# Patient Record
Sex: Female | Born: 1985 | Race: Asian | Hispanic: No | Marital: Married | Smoking: Never smoker
Health system: Southern US, Community
[De-identification: ages and names within clinical notes are randomized; demographics above are authoritative.]

---

## 2020-09-06 ENCOUNTER — Other Ambulatory Visit: Payer: Self-pay

## 2021-12-16 ENCOUNTER — Encounter: Payer: Self-pay | Admitting: Gastroenterology

## 2022-01-21 ENCOUNTER — Ambulatory Visit (INDEPENDENT_AMBULATORY_CARE_PROVIDER_SITE_OTHER): Payer: Commercial Managed Care - HMO | Admitting: Gastroenterology

## 2022-01-21 ENCOUNTER — Encounter: Payer: Self-pay | Admitting: Gastroenterology

## 2022-01-21 VITALS — BP 118/76 | HR 99 | Ht 65.0 in | Wt 158.1 lb

## 2022-01-21 DIAGNOSIS — K220 Achalasia of cardia: Secondary | ICD-10-CM

## 2022-01-21 DIAGNOSIS — R1319 Other dysphagia: Secondary | ICD-10-CM | POA: Diagnosis not present

## 2022-01-21 DIAGNOSIS — K219 Gastro-esophageal reflux disease without esophagitis: Secondary | ICD-10-CM

## 2022-01-21 MED ORDER — PANTOPRAZOLE SODIUM 40 MG PO TBEC
40.0000 mg | DELAYED_RELEASE_TABLET | Freq: Two times a day (BID) | ORAL | 4 refills | Status: AC
Start: 2022-01-21 — End: ?

## 2022-01-21 NOTE — Patient Instructions (Addendum)
If you are age 36 or older, your body mass index should be between 23-30. Your Body mass index is 26.31 kg/m. If this is out of the aforementioned range listed, please consider follow up with your Primary Care Provider.  If you are age 36 or younger, your body mass index should be between 19-25. Your Body mass index is 26.31 kg/m. If this is out of the aformentioned range listed, please consider follow up with your Primary Care Provider.   ________________________________________________________  The [facility] providers would like to encourage you to use [facility] to communicate with providers for non-urgent requests or questions.  Due to long hold times on the telephone, sending your provider a message by [facility] may be a faster and more efficient way to get a response.  Please allow 48 business hours for a response.  Please remember that this is for non-urgent requests.  _______________________________________________________  We have sent the following medications to your pharmacy for you to pick up at your convenience: Protonix 40mg  1 tablet 2 times a day  You have been scheduled for an endoscopy. Please follow written instructions given to you at your visit today. If you use inhalers (even only as needed), please bring them with you on the day of your procedure.  Chew feels well and slowly   You have been scheduled for a Barium Esophogram at [facility] (1st floor of the hospital) on 01-29-2022 at 11am. Please arrive 15 minutes prior to your appointment for registration. Make certain not to have anything to eat or drink 3 hours prior to your test. If you need to reschedule for any reason, please contact radiology at [phone] to do so. __________________________________________________________________ A barium swallow is an examination that concentrates on views of the esophagus. This tends to be a double contrast exam (barium and two liquids which, when combined, create a gas  to distend the wall of the oesophagus) or single contrast (non-ionic iodine based). The study is usually tailored to your symptoms so a good history is essential. Attention is paid during the study to the form, structure and configuration of the esophagus, looking for functional disorders (such as aspiration, dysphagia, achalasia, motility and reflux) EXAMINATION You may be asked to change into a gown, depending on the type of swallow being performed. A radiologist and radiographer will perform the procedure. The radiologist will advise you of the type of contrast selected for your procedure and direct you during the exam. You will be asked to stand, sit or lie in several different positions and to hold a small amount of fluid in your mouth before being asked to swallow while the imaging is performed .In some instances you may be asked to swallow barium coated marshmallows to assess the motility of a solid food bolus. The exam can be recorded as a digital or video fluoroscopy procedure. POST PROCEDURE It will take 1-2 days for the barium to pass through your system. To facilitate this, it is important, unless otherwise directed, to increase your fluids for the next 24-48hrs and to resume your normal diet.  This test typically takes about 30 minutes to perform. __________________________________________________________________________________

## 2022-01-21 NOTE — Progress Notes (Signed)
Chief Complaint: Dysphagia  Referring Provider:  Lorenda Ishihara, MD      ASSESSMENT AND PLAN;   #1. GERD with eso dysphagia. D/d includes eso stricture, Schatzki's ring, motility disorder, EoE, pill induced esophagitis, r/o eso carcinoma/extrinsic lesions or Achalasia.  Plan: -Continue Protonix 40 mg p.o. BID -Ba swallow with Ba tablet as well ASAP -EGD with eso bx and possibly dil.  Discussed risks and benefits including small but definite risks of eso perforation, bleeding, risks of anesthesia.  The benefits were also discussed. -I have instructed patient to chew foods and eat slowly.   HPI:    Catherine Zavala is a 36 y.o. female  Very pleasant Accompanied by her husband  C/O dysphagia to both solids/liquids, mid chest x 3 to 4 months.  Getting worse.  Associated regurgitation.  No odynophagia.  Did have heartburn previously.  Seen by Dr. Chales Salmon, started on Protonix 40 once a day which has been increased to twice daily yesterday.  Not much relief with Protonix.  No significant N/V.  No weight loss.  She denies having any melena or hematochezia.  No family history of esophageal problems  No sodas, chocolates, chewing gums, artificial sweeteners and candy. No NSAIDs  SH-married, 2 children aged 11/7.  No problems during pregnancy.  Family History  Problem Relation Age of Onset   Breast cancer Maternal Grandmother    Colon cancer Neg Hx    Rectal cancer Neg Hx    Stomach cancer Neg Hx    Esophageal cancer Neg Hx     Social History   Tobacco Use   Smoking status: Never   Smokeless tobacco: Never  Vaping Use   Vaping Use: Never used  Substance Use Topics   Alcohol use: Never    Current Outpatient Medications  Medication Sig Dispense Refill   pantoprazole (PROTONIX) 40 MG tablet Take by mouth.     No current facility-administered medications for this visit.    No Known Allergies  Review of Systems:  Constitutional: Denies fever,  chills, diaphoresis, appetite change and fatigue.  HEENT: Denies photophobia, eye pain, redness, hearing loss, ear pain, congestion, sore throat, rhinorrhea, sneezing, mouth sores, neck pain, neck stiffness and tinnitus.   Respiratory: Denies SOB, DOE, cough, chest tightness,  and wheezing.   Cardiovascular: Denies chest pain, palpitations and leg swelling.  Genitourinary: Denies dysuria, urgency, frequency, hematuria, flank pain and difficulty urinating.  Musculoskeletal: Denies myalgias, back pain, joint swelling, arthralgias and gait problem.  Skin: No rash.  Neurological: Denies dizziness, seizures, syncope, weakness, light-headedness, numbness and headaches.  Hematological: Denies adenopathy. Easy bruising, personal or family bleeding history  Psychiatric/Behavioral: No anxiety or depression     Physical Exam:    BP 118/76   Pulse 99   Ht 5\' 5"  (1.651 m)   Wt 158 lb 2 oz (71.7 kg)   SpO2 99%   BMI 26.31 kg/m  Wt Readings from Last 3 Encounters:  01/21/22 158 lb 2 oz (71.7 kg)   Constitutional:  Well-developed, in no acute distress. Psychiatric: Normal mood and affect. Behavior is normal. HEENT: Pupils normal.  Conjunctivae are normal. No scleral icterus. Cardiovascular: Normal rate, regular rhythm. No edema Pulmonary/chest: Effort normal and breath sounds normal. No wheezing, rales or rhonchi. Abdominal: Soft, nondistended. Nontender. Bowel sounds active throughout. There are no masses palpable. No hepatomegaly. Rectal: Deferred Neurological: Alert and oriented to person place and time. Skin: Skin is warm and dry. No rashes noted    01/23/22, MD 01/21/2022, 3:33  PM  Cc: Lorenda Ishihara, MD

## 2022-01-28 ENCOUNTER — Telehealth: Payer: Self-pay | Admitting: Gastroenterology

## 2022-01-28 ENCOUNTER — Other Ambulatory Visit: Payer: Self-pay | Admitting: Gastroenterology

## 2022-01-28 DIAGNOSIS — R1319 Other dysphagia: Secondary | ICD-10-CM

## 2022-01-28 DIAGNOSIS — K219 Gastro-esophageal reflux disease without esophagitis: Secondary | ICD-10-CM

## 2022-01-28 NOTE — Telephone Encounter (Signed)
Spoke to pt. She was told to call [facility] imaging to schedule her appt

## 2022-01-28 NOTE — Telephone Encounter (Signed)
Patient called regarding the imaging she has scheduled for tomorrow.  Her insurance will not cover [facility] and told her she needed to be referred to [facility], fax number [phone].  They have an appointment available for her tomorrow, but the referral will need to be sent to them asap so she can secure that appointment.  Please call patient and advise.  Thank you.

## 2022-01-29 ENCOUNTER — Ambulatory Visit
Admission: RE | Admit: 2022-01-29 | Discharge: 2022-01-29 | Disposition: A | Discharge status: Home / Self Care | Payer: Commercial Managed Care - HMO | Source: Ambulatory Visit | Attending: Gastroenterology | Admitting: Gastroenterology

## 2022-01-29 ENCOUNTER — Other Ambulatory Visit (HOSPITAL_COMMUNITY): Payer: Commercial Managed Care - HMO

## 2022-01-29 DIAGNOSIS — R1319 Other dysphagia: Secondary | ICD-10-CM

## 2022-01-29 DIAGNOSIS — K219 Gastro-esophageal reflux disease without esophagitis: Secondary | ICD-10-CM

## 2022-02-04 ENCOUNTER — Encounter: Payer: Self-pay | Admitting: Gastroenterology

## 2022-02-04 ENCOUNTER — Ambulatory Visit (AMBULATORY_SURGERY_CENTER): Payer: Commercial Managed Care - HMO | Admitting: Gastroenterology

## 2022-02-04 VITALS — BP 108/70 | HR 89 | Temp 98.4°F | Resp 14 | Ht 65.0 in | Wt 158.0 lb

## 2022-02-04 DIAGNOSIS — K219 Gastro-esophageal reflux disease without esophagitis: Secondary | ICD-10-CM | POA: Diagnosis not present

## 2022-02-04 DIAGNOSIS — K2970 Gastritis, unspecified, without bleeding: Secondary | ICD-10-CM | POA: Diagnosis not present

## 2022-02-04 DIAGNOSIS — K220 Achalasia of cardia: Secondary | ICD-10-CM

## 2022-02-04 MED ORDER — SODIUM CHLORIDE 0.9 % IV SOLN: 500.0000 mL | Freq: Once | INTRAVENOUS | Status: DC

## 2022-02-04 NOTE — Progress Notes (Signed)
Chief Complaint: Dysphagia  Referring Provider:  Lorenda Ishihara, MD      ASSESSMENT AND PLAN;   #1. GERD with eso dysphagia. D/d includes eso stricture, Schatzki's ring, motility disorder, EoE, pill induced esophagitis, r/o eso carcinoma/extrinsic lesions or Achalasia.  Plan: -Continue Protonix 40 mg p.o. BID -Ba swallow with Ba tablet as well ASAP -EGD with eso bx and possibly dil.  Discussed risks and benefits including small but definite risks of eso perforation, bleeding, risks of anesthesia.  The benefits were also discussed. -I have instructed patient to chew foods and eat slowly.  Barium swallow-achalasia For EGD with dil today HPI:    Catherine Zavala is a 36 y.o. female  Very pleasant Accompanied by her husband  C/O dysphagia to both solids/liquids, mid chest x 3 to 4 months.  Getting worse.  Associated regurgitation.  No odynophagia.  Did have heartburn previously.  Seen by Dr. Chales Salmon, started on Protonix 40 once a day which has been increased to twice daily yesterday.  Not much relief with Protonix.  No significant N/V.  No weight loss.  She denies having any melena or hematochezia.  No family history of esophageal problems  No sodas, chocolates, chewing gums, artificial sweeteners and candy. No NSAIDs  SH-married, 2 children aged 11/7.  No problems during pregnancy.  Family History  Problem Relation Age of Onset   Breast cancer Maternal Grandmother    Colon cancer Neg Hx    Rectal cancer Neg Hx    Stomach cancer Neg Hx    Esophageal cancer Neg Hx     Social History   Tobacco Use   Smoking status: Never   Smokeless tobacco: Never  Vaping Use   Vaping Use: Never used  Substance Use Topics   Alcohol use: Never   Drug use: Never    Current Outpatient Medications  Medication Sig Dispense Refill   pantoprazole (PROTONIX) 40 MG tablet Take 1 tablet (40 mg total) by mouth 2 (two) times daily. 180 tablet 4   fluticasone (FLONASE) 50  MCG/ACT nasal spray Place 1 spray into both nostrils daily. (Patient not taking: Reported on 02/04/2022)     Current Facility-Administered Medications  Medication Dose Route Frequency Provider Last Rate Last Admin   0.9 %  sodium chloride infusion  500 mL Intravenous Once Lynann Bologna, MD        No Known Allergies  Review of Systems:  Constitutional: Denies fever, chills, diaphoresis, appetite change and fatigue.  HEENT: Denies photophobia, eye pain, redness, hearing loss, ear pain, congestion, sore throat, rhinorrhea, sneezing, mouth sores, neck pain, neck stiffness and tinnitus.   Respiratory: Denies SOB, DOE, cough, chest tightness,  and wheezing.   Cardiovascular: Denies chest pain, palpitations and leg swelling.  Genitourinary: Denies dysuria, urgency, frequency, hematuria, flank pain and difficulty urinating.  Musculoskeletal: Denies myalgias, back pain, joint swelling, arthralgias and gait problem.  Skin: No rash.  Neurological: Denies dizziness, seizures, syncope, weakness, light-headedness, numbness and headaches.  Hematological: Denies adenopathy. Easy bruising, personal or family bleeding history  Psychiatric/Behavioral: No anxiety or depression     Physical Exam:    BP 97/60   Pulse 95   Temp 98.4 F (36.9 C)   Ht 5\' 5"  (1.651 m)   Wt 158 lb (71.7 kg)   LMP 01/09/2022 (Exact Date) Comment: Pt. declined pregnancy test  SpO2 100%   BMI 26.29 kg/m  Wt Readings from Last 3 Encounters:  02/04/22 158 lb (71.7 kg)  01/21/22 158 lb 2 oz (  71.7 kg)   Constitutional:  Well-developed, in no acute distress. Psychiatric: Normal mood and affect. Behavior is normal. HEENT: Pupils normal.  Conjunctivae are normal. No scleral icterus. Cardiovascular: Normal rate, regular rhythm. No edema Pulmonary/chest: Effort normal and breath sounds normal. No wheezing, rales or rhonchi. Abdominal: Soft, nondistended. Nontender. Bowel sounds active throughout. There are no masses palpable. No  hepatomegaly. Rectal: Deferred Neurological: Alert and oriented to person place and time. Skin: Skin is warm and dry. No rashes noted    Edman Circle, MD 02/04/2022, 4:01 PM  Cc: Lorenda Ishihara, MD

## 2022-02-04 NOTE — Addendum Note (Signed)
Addended by: Alberteen Sam E on: 02/04/2022 10:54 AM   Modules accepted: Orders

## 2022-02-04 NOTE — Patient Instructions (Addendum)
Post dilation diet handout provided   Await pathology results  YOU HAD AN ENDOSCOPIC PROCEDURE TODAY AT THE  ENDOSCOPY CENTER:   Refer to the procedure report that was given to you for any specific questions about what was found during the examination.  If the procedure report does not answer your questions, please call your gastroenterologist to clarify.  If you requested that your care partner not be given the details of your procedure findings, then the procedure report has been included in a sealed envelope for you to review at your convenience later.  YOU SHOULD EXPECT: Some feelings of bloating in the abdomen. Passage of more gas than usual.  Walking can help get rid of the air that was put into your GI tract during the procedure and reduce the bloating. If you had a lower endoscopy (such as a colonoscopy or flexible sigmoidoscopy) you may notice spotting of blood in your stool or on the toilet paper. If you underwent a bowel prep for your procedure, you may not have a normal bowel movement for a few days.  Please Note:  You might notice some irritation and congestion in your nose or some drainage.  This is from the oxygen used during your procedure.  There is no need for concern and it should clear up in a day or so.  SYMPTOMS TO REPORT IMMEDIATELY:  Following upper endoscopy (EGD)  Vomiting of blood or coffee ground material  New chest pain or pain under the shoulder blades  Painful or persistently difficult swallowing  New shortness of breath  Fever of 100F or higher  Black, tarry-looking stools  For urgent or emergent issues, a gastroenterologist can be reached at any hour by calling [phone]. Do not use MyChart messaging for urgent concerns.    DIET:  Clear liquid diet for 1 hour ( until 5:25 pm). Staring at 5:25 pm soft diet until tomorrow.  See post dilation diet handout for more information. Drink plenty of fluids but you should avoid alcoholic beverages for 24  hours.  ACTIVITY:  You should plan to take it easy for the rest of today and you should NOT DRIVE or use heavy machinery until tomorrow (because of the sedation medicines used during the test).    FOLLOW UP: Our staff will call the number listed on your records 24-72 hours following your procedure to check on you and address any questions or concerns that you may have regarding the information given to you following your procedure. If we do not reach you, we will leave a message.  We will attempt to reach you two times.  During this call, we will ask if you have developed any symptoms of COVID 19. If you develop any symptoms (ie: fever, flu-like symptoms, shortness of breath, cough etc.) before then, please call [phone].  If you test positive for Covid 19 in the 2 weeks post procedure, please call and report this information to [place].    If any biopsies were taken you will be contacted by phone or by letter within the next 1-3 weeks.  Please call us at [phone] if you have not heard about the biopsies in 3 weeks.    SIGNATURES/CONFIDENTIALITY: You and/or your care partner have signed paperwork which will be entered into your electronic medical record.  These signatures attest to the fact that that the information above on your After Visit Summary has been reviewed and is understood.  Full responsibility of the confidentiality of this discharge information lies  with you and/or your care-partner.

## 2022-02-04 NOTE — Progress Notes (Signed)
Pt non-responsive, VVS, Report to RN  °

## 2022-02-04 NOTE — Op Note (Signed)
[facility] Patient Name: Catherine Zavala Procedure Date: 02/04/2022 4:03 PM MRN: [id number] Endoscopist: Lynann Bologna , MD Age: 36 Referring MD:  Date of Birth: [birth year] Gender: Female Account #: [id number] Procedure:                Upper GI endoscopy Indications:              Dysphagia with abn barium swallow showing achalasia Medicines:                Monitored Anesthesia Care Procedure:                Pre-Anesthesia Assessment:                           - Prior to the procedure, a History and Physical                            was performed, and patient medications and                            allergies were reviewed. The patient's tolerance of                            previous anesthesia was also reviewed. The risks                            and benefits of the procedure and the sedation                            options and risks were discussed with the patient.                            All questions were answered, and informed consent                            was obtained. Prior Anticoagulants: The patient has                            taken no previous anticoagulant or antiplatelet                            agents. ASA Grade Assessment: I - A normal, healthy                            patient. After reviewing the risks and benefits,                            the patient was deemed in satisfactory condition to                            undergo the procedure.                           After obtaining informed consent, the endoscope was  passed under direct vision. Throughout the                            procedure, the patient's blood pressure, pulse, and                            oxygen saturations were monitored continuously. The                            Endoscope was introduced through the mouth, and                            advanced to the second part of duodenum. The upper                            GI endoscopy  was accomplished without difficulty.                            The patient tolerated the procedure well. Scope In: Scope Out: Findings:                 The esophagus was moderately dilated, aperistaltic                            with narrowing at GE junction, 38 cm from the                            incisors. There was mild resistance with a "catch"                            to endoscope advancement into the stomach. The                            Z-line was regular. The gastroesophageal junction                            and cardia were normal on retroflexed view. No                            pseudo-achalasia or Candida esophagitis. Biopsies                            were taken with a cold forceps for histology. A TTS                            dilator was passed through the scope. Dilation with                            a 13.5-14.5-15.5 mm balloon dilator was performed                            to 15.5 mm.  Localized mild inflammation characterized by                            erythema was found in the gastric antrum. Biopsies                            were taken with a cold forceps for histology.                           The examined duodenum was normal. Complications:            No immediate complications. Estimated Blood Loss:     Estimated blood loss: none. Impression:               - The esophageal examination was consistent with                            achalasia. Biopsied. Dilated.                           - Minimal gastritis.                           - No evidence of pseudo achalasia. Recommendation:           - Patient has a contact number available for                            emergencies. The signs and symptoms of potential                            delayed complications were discussed with the                            patient. Return to normal activities tomorrow.                            Written discharge instructions were  provided to the                            patient.                           - Post dilatation diet.                           - Continue present medications.                           - Proceed with esophageal manometry as soon as                            available.                           - Further treatment after manometry.                           -  The findings and recommendations were discussed                            with the patient's family. Lynann Bologna, MD 02/04/2022 4:34:31 PM This report has been signed electronically.

## 2022-02-05 ENCOUNTER — Telehealth: Payer: Self-pay

## 2022-02-05 NOTE — Telephone Encounter (Signed)
  Follow up Call-     02/04/2022    2:54 PM  Call back number  Post procedure Call Back phone  # [phone]  Permission to leave phone message Yes     Patient questions:  Do you have a fever, pain , or abdominal swelling? No. Pain Score  0 *  Have you tolerated food without any problems? Yes.    Have you been able to return to your normal activities? Yes.    Do you have any questions about your discharge instructions: Diet   No. Medications  No. Follow up visit  No.  Do you have questions or concerns about your Care? No.  Actions: * If pain score is 4 or above: No action needed, pain <4.

## 2022-02-07 ENCOUNTER — Telehealth: Payer: Self-pay | Admitting: Gastroenterology

## 2022-02-07 NOTE — Telephone Encounter (Signed)
Patient called requesting to speak with a nurse regarding her EGD results.

## 2022-02-07 NOTE — Telephone Encounter (Signed)
Communicated through a Hindi Interpreter Pt requesting results from recent EGD. Pt was notified that Pathology report was not complete. Pt notified that once the results are complete and Dr. Chales Abrahams reviews them then we will reach out to her.  Pt verbalized understanding with all questions answered.

## 2022-02-09 ENCOUNTER — Encounter: Payer: Self-pay | Admitting: Gastroenterology

## 2022-02-11 ENCOUNTER — Other Ambulatory Visit: Payer: Self-pay

## 2022-02-11 DIAGNOSIS — K220 Achalasia of cardia: Secondary | ICD-10-CM

## 2022-02-14 ENCOUNTER — Telehealth: Payer: Self-pay | Admitting: Gastroenterology

## 2022-02-14 ENCOUNTER — Telehealth: Payer: Self-pay

## 2022-02-14 NOTE — Telephone Encounter (Signed)
Spoke with the patient with Hindi interpreter on the line to assist if needed. The patient speaks English but prefers to have interpreter available for her if she encounters difficulty. Patient's esophageal manometry will be 02/19/22. She will begin fasting at 10 am. She will arrive to [facility] at 1:30 pm. Questions invited. She did not have any questions. Dow Adolph, RN, supervisor of [facility] notified.

## 2022-02-14 NOTE — Telephone Encounter (Signed)
Communicated conversation through an [employer] stated that she feels that her swallowing has gotten worse since her recent Endoscopy done on the 02/04/2022   and its even hard  to tolerate water or tea. Pt is not happy about the Esophageal Monomentry being scheduled so far out. Scheduled on 06/11/2022 at 12:30: Pt states that she cannot wait that long for something to be done. Pt has an office visit previously scheduled for 02/18/2022 at 9:30 AM with Dr. Chales Abrahams  Pt was notified that if she cannot tolerate swallowing water or tea then she needs to go to the ED for an evaluation and treatment.  Pt verbalized understanding with all questions answered.

## 2022-02-19 ENCOUNTER — Encounter (HOSPITAL_COMMUNITY): Payer: Self-pay | Admitting: Gastroenterology

## 2022-02-19 ENCOUNTER — Ambulatory Visit (HOSPITAL_COMMUNITY)
Admission: RE | Admit: 2022-02-19 | Discharge: 2022-02-19 | Disposition: A | Discharge status: Home / Self Care | Payer: Commercial Managed Care - HMO | Source: Ambulatory Visit | Attending: Gastroenterology | Admitting: Gastroenterology

## 2022-02-19 ENCOUNTER — Encounter (HOSPITAL_COMMUNITY)
Admission: RE | Disposition: A | Discharge status: Home / Self Care | Payer: Self-pay | Source: Ambulatory Visit | Attending: Gastroenterology

## 2022-02-19 DIAGNOSIS — R1310 Dysphagia, unspecified: Secondary | ICD-10-CM

## 2022-02-19 DIAGNOSIS — K219 Gastro-esophageal reflux disease without esophagitis: Secondary | ICD-10-CM | POA: Insufficient documentation

## 2022-02-19 DIAGNOSIS — K220 Achalasia of cardia: Secondary | ICD-10-CM

## 2022-02-19 SURGERY — MANOMETRY, ESOPHAGUS
Anesthesia: Monitor Anesthesia Care

## 2022-02-19 MED ORDER — LIDOCAINE VISCOUS HCL 2 % MT SOLN
OROMUCOSAL | Status: AC
  Filled 2022-02-19: qty 15

## 2022-02-19 SURGICAL SUPPLY — 2 items
FACESHIELD LNG OPTICON STERILE (SAFETY) IMPLANT
GLOVE BIO SURGEON STRL SZ8 (GLOVE) ×4 IMPLANT

## 2022-02-19 NOTE — Progress Notes (Signed)
Esophageal Manometry done per protocol. Patient tolerated well without distress or complication.  

## 2022-02-20 ENCOUNTER — Encounter (HOSPITAL_COMMUNITY): Payer: Self-pay | Admitting: Gastroenterology

## 2022-02-26 ENCOUNTER — Telehealth: Payer: Self-pay | Admitting: Gastroenterology

## 2022-02-26 DIAGNOSIS — R1310 Dysphagia, unspecified: Secondary | ICD-10-CM

## 2022-02-26 DIAGNOSIS — K220 Achalasia of cardia: Secondary | ICD-10-CM

## 2022-02-26 NOTE — Progress Notes (Signed)
Have discussed results of manometry with the patient I have also discussed with Dr.Rishi  Pawa at [facility]  Linda/Steven, Please fax notes to [phone] ATTN: Dr Rodman Comp RE: Possible POEM.

## 2022-02-26 NOTE — Telephone Encounter (Signed)
Manometry consistent with type II achalasia  Plan: -Refer to Dr. Rodman Comp at [facility] for possible POEM  RG

## 2022-02-27 ENCOUNTER — Telehealth: Payer: Self-pay | Admitting: Gastroenterology

## 2022-02-27 NOTE — Telephone Encounter (Signed)
Patient called requesting a phone call from Dr. Chales Abrahams today, if possible.

## 2022-02-27 NOTE — Telephone Encounter (Signed)
See manometry result note for details.

## 2022-02-28 ENCOUNTER — Ambulatory Visit: Payer: Commercial Managed Care - HMO | Admitting: Gastroenterology

## 2022-02-28 NOTE — Telephone Encounter (Signed)
Called Unable to leave voicemail as it has not been set up RG

## 2022-04-09 ENCOUNTER — Ambulatory Visit: Payer: Commercial Managed Care - HMO | Admitting: Gastroenterology

## 2022-06-16 ENCOUNTER — Encounter: Payer: Self-pay | Admitting: Emergency Medicine

## 2022-06-16 ENCOUNTER — Ambulatory Visit
Admission: EM | Admit: 2022-06-16 | Discharge: 2022-06-16 | Disposition: A | Discharge status: Home / Self Care | Payer: 59 | Attending: Family Medicine | Admitting: Family Medicine

## 2022-06-16 DIAGNOSIS — K122 Cellulitis and abscess of mouth: Secondary | ICD-10-CM | POA: Insufficient documentation

## 2022-06-16 DIAGNOSIS — J029 Acute pharyngitis, unspecified: Secondary | ICD-10-CM | POA: Insufficient documentation

## 2022-06-16 DIAGNOSIS — K220 Achalasia of cardia: Secondary | ICD-10-CM | POA: Diagnosis not present

## 2022-06-16 LAB — POCT RAPID STREP A (OFFICE): Rapid Strep A Screen: NEGATIVE

## 2022-06-16 MED ORDER — AZITHROMYCIN 250 MG PO TABS: 250.0000 mg | ORAL_TABLET | Freq: Every day | ORAL | 0 refills | Status: AC

## 2022-06-16 NOTE — ED Triage Notes (Signed)
Patient c/o sore throat x 4 days, denies fever, cough and congestion.  Patient has taken OTC Vicks meds.

## 2022-06-16 NOTE — Discharge Instructions (Addendum)
Advised patient to take medication as directed with food to completion.  Encouraged patient to increase daily water intake while taking this medication.  Advised if symptoms worsen and/or unresolved please follow-up with PCP or here for further evaluation.  Advised we will follow-up with throat culture results once received.

## 2022-06-16 NOTE — ED Provider Notes (Signed)
[facility]    CSN: [id number] Arrival date & time: 06/16/22  1051      History   Chief Complaint Chief Complaint  Patient presents with   Sore Throat    HPI Catherine Zavala is a 36 y.o. female.   HPI 36 year old female presents with sore throat for 4 days.  Patient denies fever, cough, or congestion.  Patient reports taking OTC Vick lozenges.  PMH significant for dysphagia and achalasia.  History reviewed. No pertinent past medical history.  Patient Active Problem List   Diagnosis Date Noted   Dysphagia    Achalasia     Past Surgical History:  Procedure Laterality Date   ESOPHAGEAL MANOMETRY N/A 02/19/2022   Procedure: ESOPHAGEAL MANOMETRY (EM);  Surgeon: Jackquline Denmark, MD;  Location: [facility];  Service: Gastroenterology;  Laterality: N/A;    OB History   No obstetric history on file.      Home Medications    Prior to Admission medications   Medication Sig Start Date End Date Taking? Authorizing Provider  azithromycin (ZITHROMAX) 250 MG tablet Take 1 tablet (250 mg total) by mouth daily. Take first 2 tablets together, then 1 every day until finished. 06/16/22  Yes Eliezer Lofts, FNP  pantoprazole (PROTONIX) 40 MG tablet Take 1 tablet (40 mg total) by mouth 2 (two) times daily. 01/21/22  Yes Jackquline Denmark, MD  fluticasone [facility]) 50 MCG/ACT nasal spray Place 1 spray into both nostrils daily. Patient not taking: Reported on 02/04/2022 01/21/22   [provider]    Family History Family History  Problem Relation Age of Onset   Breast cancer Maternal Grandmother    Colon cancer Neg Hx    Rectal cancer Neg Hx    Stomach cancer Neg Hx    Esophageal cancer Neg Hx     Social History Social History   Tobacco Use   Smoking status: Never   Smokeless tobacco: Never  Vaping Use   Vaping Use: Never used  Substance Use Topics   Alcohol use: Never   Drug use: Never     Allergies   Patient has no known allergies.   Review of  Systems Review of Systems  HENT:  Positive for sore throat.   All other systems reviewed and are negative.    Physical Exam Triage Vital Signs ED Triage Vitals  Enc Vitals Group     BP 06/16/22 1216 109/73     Pulse Rate 06/16/22 1216 84     Resp 06/16/22 1216 18     Temp 06/16/22 1216 98.5 F (36.9 C)     Temp Source 06/16/22 1216 Oral     SpO2 06/16/22 1216 96 %     Weight 06/16/22 1218 154 lb 5.2 oz (70 kg)     Height 06/16/22 1218 5\' 6"  (1.676 m)     Head Circumference --      Peak Flow --      Pain Score 06/16/22 1218 7     Pain Loc --      Pain Edu? --      Excl. in [place]? --    No data found.  Updated Vital Signs BP 109/73 (BP Location: Right Arm)   Pulse 84   Temp 98.5 F (36.9 C) (Oral)   Resp 18   Ht 5\' 6"  (1.676 m)   Wt 154 lb 5.2 oz (70 kg)   LMP 06/13/2022   SpO2 96%   BMI 24.91 kg/m      Physical Exam  Vitals and nursing note reviewed.  Constitutional:      General: She is not in acute distress.    Appearance: She is well-developed and normal weight. She is not ill-appearing.  HENT:     Head: Normocephalic and atraumatic.     Right Ear: Tympanic membrane and ear canal normal.     Left Ear: Tympanic membrane and ear canal normal.     Mouth/Throat:     Mouth: Mucous membranes are moist.     Pharynx: Uvula midline. Pharyngeal swelling and uvula swelling present.     Tonsils: 2+ on the right. 2+ on the left.  Eyes:     Conjunctiva/sclera: Conjunctivae normal.     Pupils: Pupils are equal, round, and reactive to light.  Cardiovascular:     Rate and Rhythm: Normal rate and regular rhythm.     Heart sounds: Normal heart sounds. No murmur heard. Pulmonary:     Effort: Pulmonary effort is normal.     Breath sounds: Normal breath sounds. No wheezing, rhonchi or rales.  Musculoskeletal:     Cervical back: Normal range of motion and neck supple.  Skin:    General: Skin is warm and dry.  Neurological:     General: No focal deficit present.      Mental Status: She is alert and oriented to person, place, and time.      UC Treatments / Results  Labs (all labs ordered are listed, but only abnormal results are displayed) Labs Reviewed  CULTURE, GROUP A STREP [facility])  POCT RAPID STREP A (OFFICE)    EKG   Radiology No results found.  Procedures Procedures (including critical care time)  Medications Ordered in UC Medications - No data to display  Initial Impression / Assessment and Plan / UC Course  I have reviewed the triage vital signs and the nursing notes.  Pertinent labs & imaging results that were available during my care of the patient were reviewed by me and considered in my medical decision making (see chart for details).     MDM: 1.  Uvulitis-Rx'd Zithromax, throat culture ordered. Advised patient to take medication as directed with food to completion.  Encouraged patient to increase daily water intake while taking this medication.  Advised if symptoms worsen and/or unresolved please follow-up with PCP or here for further evaluation.  Advised we will follow-up with throat culture results once received.  Patient discharged home, hemodynamically stable. Final Clinical Impressions(s) / UC Diagnoses   Final diagnoses:  Uvulitis     Discharge Instructions      Advised patient to take medication as directed with food to completion.  Encouraged patient to increase daily water intake while taking this medication.  Advised if symptoms worsen and/or unresolved please follow-up with PCP or here for further evaluation.  Advised we will follow-up with throat culture results once received.     ED Prescriptions     Medication Sig Dispense Auth. Provider   azithromycin (ZITHROMAX) 250 MG tablet Take 1 tablet (250 mg total) by mouth daily. Take first 2 tablets together, then 1 every day until finished. 6 tablet Eliezer Lofts, FNP      PDMP not reviewed this encounter.   Eliezer Lofts, [place] 06/16/22 1344

## 2022-06-19 LAB — CULTURE, GROUP A STREP (THRC)

## 2023-09-20 IMAGING — RF DG ESOPHAGUS
7 series · 14 of 24 positions shown · non-contrast
Comparison: None.

CLINICAL DATA: Gastroesophageal reflux, dysphagia.

EXAM:
ESOPHOGRAM / BARIUM SWALLOW / BARIUM TABLET STUDY
TECHNIQUE: Combined double contrast and single contrast examination performed
using effervescent crystals, thick barium liquid, and thin barium
liquid. The patient was observed with fluoroscopy swallowing a 13 mm
barium sulphate tablet.
FLUOROSCOPY:
Radiation Exposure Index (as provided by the fluoroscopic device): 2
minutes 54 seconds 11 mGy

[Series 1: sequence · 0.31mm/px · 1 of 5 frames shown (1 of 4)]
[frame 1/5]
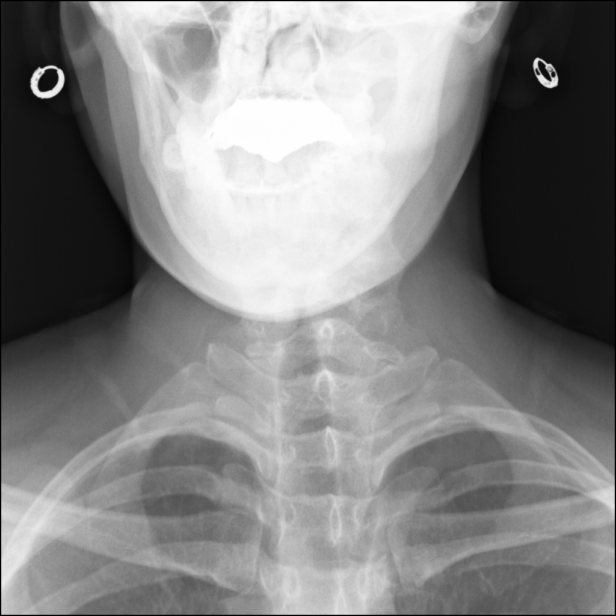

[Series 2: one shot · 1 of 1 slices shown (1 of 3)]
[im 1/1]
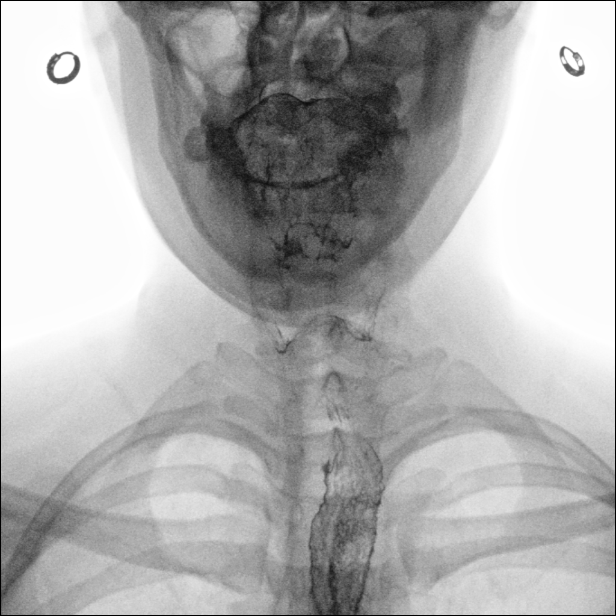

[Series 3: sequence · 0.31mm/px · 1 of 4 frames shown (2 of 4)]
[frame 4/4]
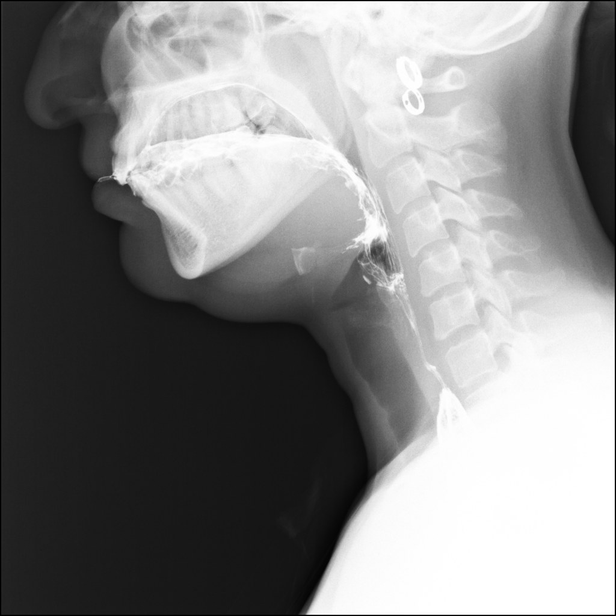

[Series 4: one shot · 7 of 20 slices shown (2 of 3)]
[im 2/20]
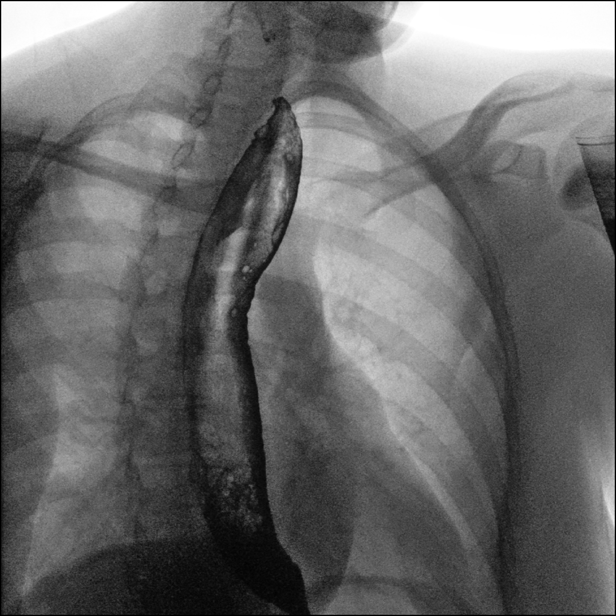
[im 4/20]
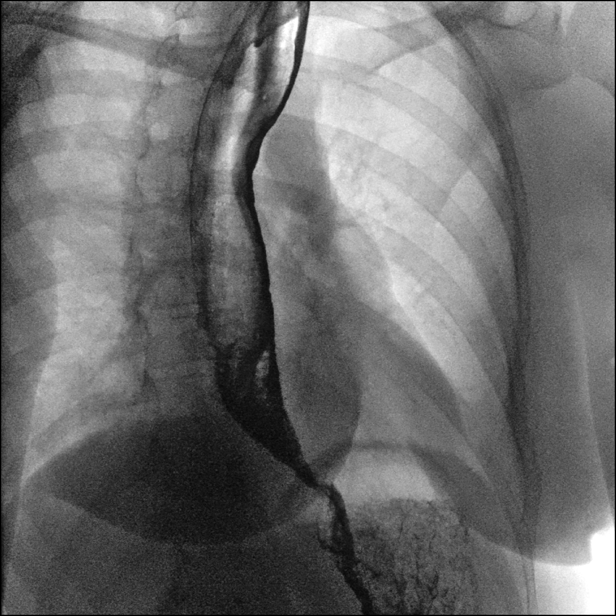
[im 8/20]
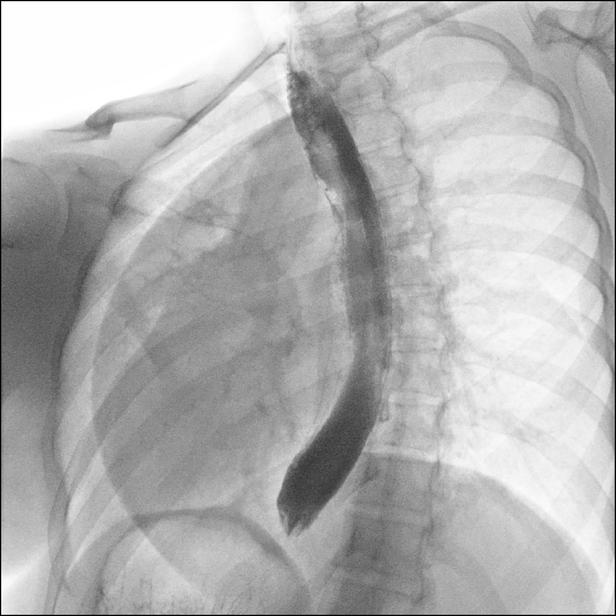
[im 11/20]
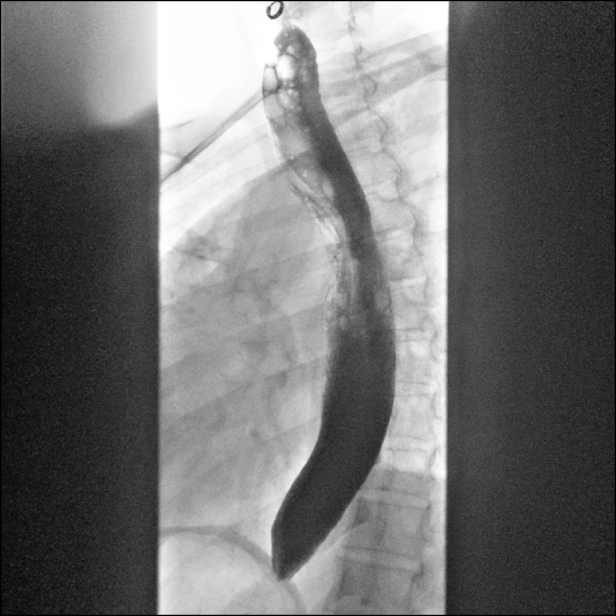
[im 13/20]
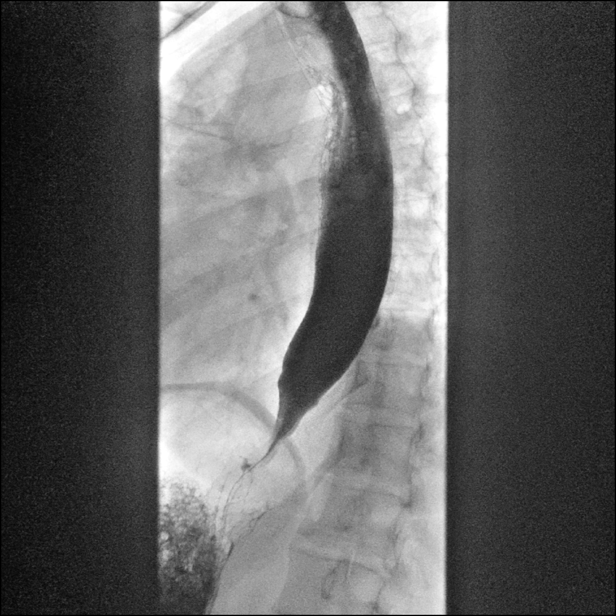
[im 16/20]
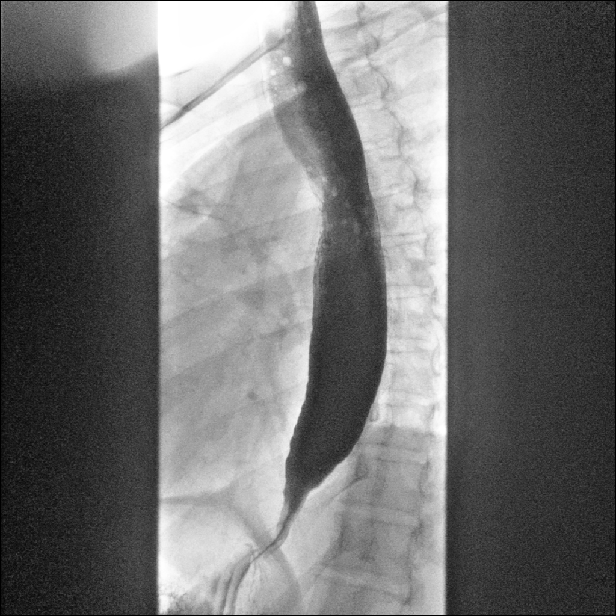
[im 20/20]
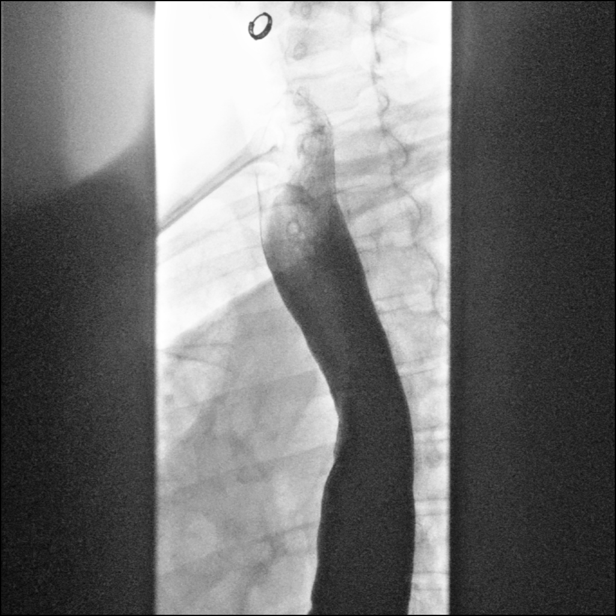

[Series 5: sequence · 1 of 5 frames shown (3 of 4)]
[frame 5/5]
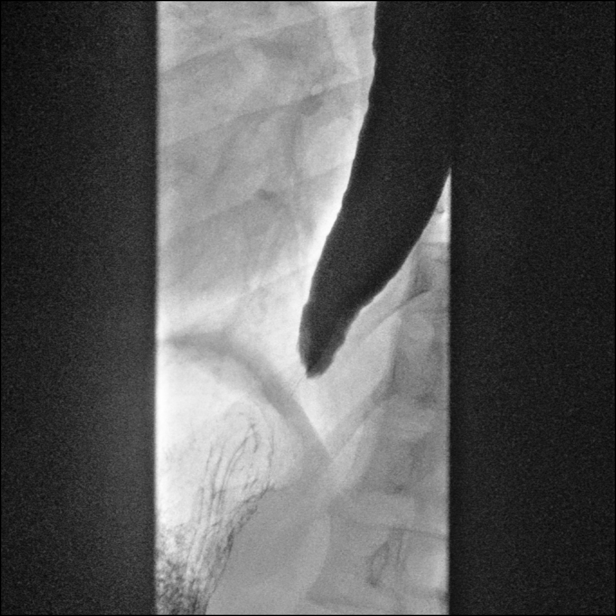

[Series 6: sequence · 2 of 6 frames shown (4 of 4)]
[frame 1/6]
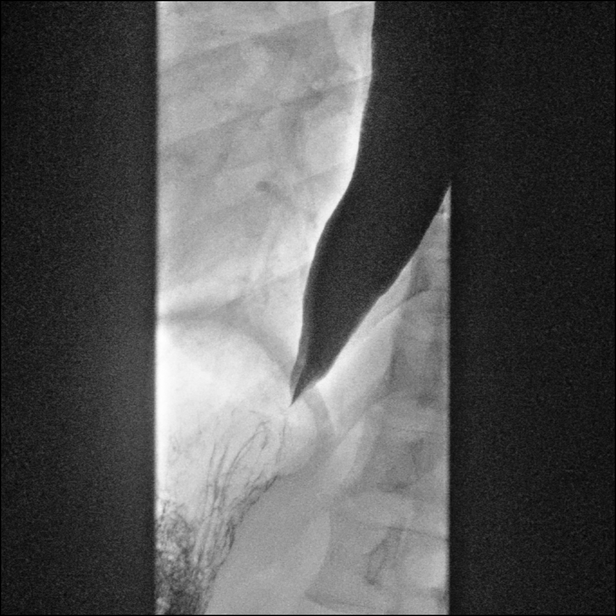
[frame 6/6]
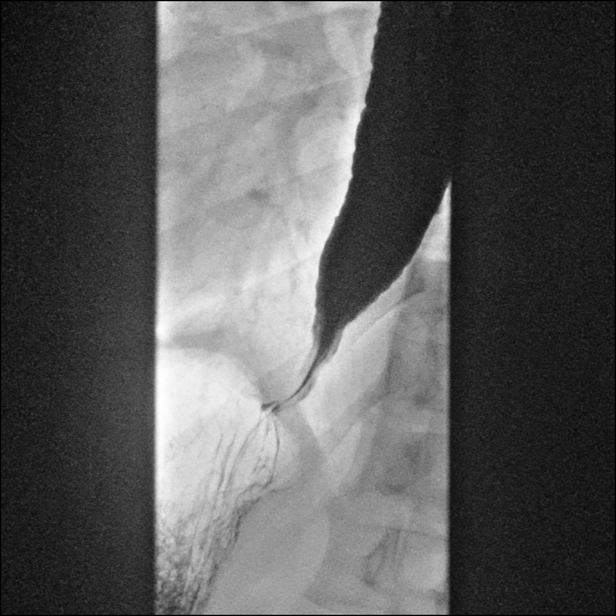

[Series 7: one shot · 1 of 4 slices shown (3 of 3)]
[im 4/4]
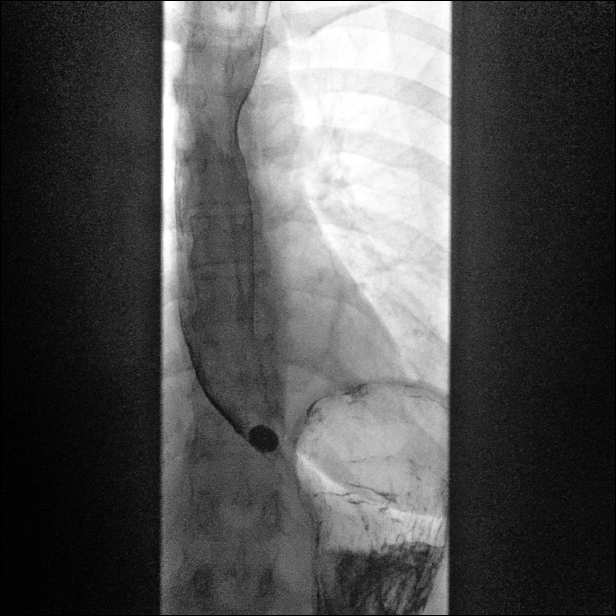

[14 of 24 positions shown; findings below may reference images not displayed]

FINDINGS: Normal swallowing mechanism. Marked stasis of contrast in the
esophagus which is dilated throughout its course. There is beak like
narrowing in the distal esophagus/gastroesophageal junction, through
which a 13 mm barium tablet would not pass.
IMPRESSION: Decreased esophageal motility, diffuse esophageal dilatation and
beak like narrowing at the gastroesophageal junction, findings
suggestive of achalasia.
